# Patient Record
Sex: Male | Born: 1995 | Race: Black or African American | Hispanic: No | Marital: Single | Smoking: Never smoker
Health system: Southern US, Community
[De-identification: ages and names within clinical notes are randomized; demographics above are authoritative.]

## PROBLEM LIST (undated history)

## (undated) DIAGNOSIS — J45909 Unspecified asthma, uncomplicated: Secondary | ICD-10-CM

## (undated) DIAGNOSIS — R51 Headache: Secondary | ICD-10-CM

## (undated) DIAGNOSIS — R519 Headache, unspecified: Secondary | ICD-10-CM

## (undated) HISTORY — PX: HAND SURGERY: SHX662

## (undated) HISTORY — PX: KNEE SURGERY: SHX244

---
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
## 2016-05-01 ENCOUNTER — Encounter: Payer: Self-pay | Admitting: *Deleted

## 2016-05-01 ENCOUNTER — Emergency Department
Admission: EM | Admit: 2016-05-01 | Discharge: 2016-05-01 | Disposition: A | Discharge status: Home / Self Care | Payer: BLUE CROSS/BLUE SHIELD | Attending: Emergency Medicine | Admitting: Emergency Medicine

## 2016-05-01 ENCOUNTER — Emergency Department: Payer: BLUE CROSS/BLUE SHIELD

## 2016-05-01 DIAGNOSIS — R509 Fever, unspecified: Secondary | ICD-10-CM | POA: Diagnosis present

## 2016-05-01 DIAGNOSIS — J45909 Unspecified asthma, uncomplicated: Secondary | ICD-10-CM | POA: Insufficient documentation

## 2016-05-01 DIAGNOSIS — B349 Viral infection, unspecified: Secondary | ICD-10-CM | POA: Diagnosis not present

## 2016-05-01 DIAGNOSIS — G44209 Tension-type headache, unspecified, not intractable: Secondary | ICD-10-CM | POA: Diagnosis not present

## 2016-05-01 HISTORY — DX: Unspecified asthma, uncomplicated: J45.909

## 2016-05-01 LAB — POCT RAPID STREP A: Streptococcus, Group A Screen (Direct): NEGATIVE

## 2016-05-01 MED ORDER — BUTALBITAL-APAP-CAFFEINE 50-325-40 MG PO TABS: 1.0000 | ORAL_TABLET | Freq: Four times a day (QID) | ORAL | 0 refills | Status: DC | PRN

## 2016-05-01 NOTE — Discharge Instructions (Signed)
Advised to follow-up with family doctor for further evaluation of headache.

## 2016-05-01 NOTE — ED Provider Notes (Signed)
[facility] Emergency Department Provider Note   ____________________________________________   First MD Initiated Contact with Patient 05/01/16 1523     (approximate)  I have reviewed the triage vital signs and the nursing notes.   HISTORY  Chief Complaint Fever; Headache; and Sore Throat    HPI Shane Mccarthy is a 21 y.o. male patient complaining of headache, sore throat, and body aches 4 days. Patient denies sinus congestion. Patient denies vertigo or vision disturbance. Patient stated mild transient relief with ibuprofen of his body ache and headache. Mother is with patient and insistent that extensive workup be conducted for his headaches since it the  worse  he has ever experienced. Patient rates his headache and bodyache as 8/10. Patient described complaints "achy".   Past Medical History:  Diagnosis Date  . Asthma     There are no active problems to display for this patient.   History reviewed. No pertinent surgical history.  Prior to Admission medications   Medication Sig Start Date End Date Taking? Authorizing Provider  butalbital-acetaminophen-caffeine (FIORICET, ESGIC) [phone] MG tablet Take 1-2 tablets by mouth every 6 (six) hours as needed for headache. 05/01/16 05/01/17  Joni Reining, PA-C    Allergies Patient has no known allergies.  History reviewed. No pertinent family history.  Social History Social History  Substance Use Topics  . Smoking status: Never Smoker  . Smokeless tobacco: Never Used  . Alcohol use No    Review of Systems Constitutional: No fever/chills. Bodyache Eyes: No visual changes. ENT: No sore throat. Cardiovascular: Denies chest pain. Respiratory: Denies shortness of breath. Gastrointestinal: No abdominal pain.  No nausea, no vomiting.  No diarrhea.  No constipation. Genitourinary: Negative for dysuria. Musculoskeletal: Negative for back pain. Skin: Negative for rash. Neurological: Positive  for headaches, but denied focal weakness or numbness.  ____________________________________________   PHYSICAL EXAM:  VITAL SIGNS: ED Triage Vitals  Enc Vitals Group     BP 05/01/16 1508 137/79     Pulse Rate 05/01/16 1508 77     Resp 05/01/16 1508 18     Temp 05/01/16 1508 (!) 100.4 F (38 C)     Temp Source 05/01/16 1508 Oral     SpO2 05/01/16 1508 97 %     Weight 05/01/16 1509 200 lb (90.7 kg)     Height 05/01/16 1509  (1.753 m)     Head Circumference --      Peak Flow --      Pain Score 05/01/16 1508 8     Pain Loc --      Pain Edu? --      Excl. in GC? --     Constitutional: Alert and oriented. Well appearing and in no acute distress. Eyes: Conjunctivae are normal. PERRL. EOMI. Head: Atraumatic. Nose: No congestion/rhinnorhea. Mouth/Throat: Mucous membranes are moist.  Oropharynx non-erythematous. Neck: No stridor.  No cervical spine tenderness to palpation. Hematological/Lymphatic/Immunilogical: No cervical lymphadenopathy. Cardiovascular: Normal rate, regular rhythm. Grossly normal heart sounds.  Good peripheral circulation. Respiratory: Normal respiratory effort.  No retractions. Lungs CTAB. Gastrointestinal: Soft and nontender. No distention. No abdominal bruits. No CVA tenderness. Musculoskeletal: No lower extremity tenderness nor edema.  No joint effusions. Neurologic:  Normal speech and language. No gross focal neurologic deficits are appreciated. No gait instability. Skin:  Skin is warm, dry and intact. No rash noted. Psychiatric: Mood and affect are normal. Speech and behavior are normal.  ____________________________________________   LABS (all labs ordered are listed, but  only abnormal results are displayed)  Labs Reviewed  POCT RAPID STREP A   ____________________________________________  EKG   ____________________________________________  RADIOLOGY   _CT of the head unremarkable.  ___________________________________________   PROCEDURES  Procedure(s) performed: None  Procedures  Critical Care performed: No  ____________________________________________   INITIAL IMPRESSION / ASSESSMENT AND PLAN / ED COURSE  Pertinent labs & imaging results that were available during my care of the patient were reviewed by me and considered in my medical decision making (see chart for details). Tension headache and viral illness. CT scan pending.        ____________________________________________   FINAL CLINICAL IMPRESSION(S) / ED DIAGNOSES  Final diagnoses:  Viral illness  Tension headache  Discuss CT findings with patient and mother. Mother is requesting additional testing consisted of blood work due to the patient having headaches for 2-3 months. Advised mother that fuerther evaluation and treatment should be done by PCP due to the chronic nature of his complaint. Oral temperature has decreased from 100.4-98.6 without intervention.    NEW MEDICATIONS STARTED DURING THIS VISIT:  New Prescriptions   BUTALBITAL-ACETAMINOPHEN-CAFFEINE (FIORICET, ESGIC) 50-325-40 MG TABLET    Take 1-2 tablets by mouth every 6 (six) hours as needed for headache.     Note:  This document was prepared using Dragon voice recognition software and may include unintentional dictation errors.    Joni Reining, PA-C 05/01/16 1614    Nita Sickle, MD 05/05/16 [phone]

## 2016-05-01 NOTE — ED Triage Notes (Signed)
States headache, sore throat, body aches since Sunday, denies any cough, awake and alert in no acute distress

## 2016-05-01 NOTE — ED Notes (Signed)
See triage note, pt reports HA since Sunday, pt reports sore throat started follow by headache. Pt reports this has happened in December and again in the beginning of this year. Denies sensitivity to light however reports "headache to top of head and left side of face." Pt also reports pain increases to head when he bends down and when he lays down. Pt reports pain gets better when he gets up. Pt A&O at this time.

## 2016-05-26 ENCOUNTER — Encounter: Payer: Self-pay | Admitting: *Deleted

## 2016-05-28 ENCOUNTER — Encounter
Admission: RE | Disposition: A | Discharge status: Home / Self Care | Payer: Self-pay | Source: Ambulatory Visit | Attending: Otolaryngology

## 2016-05-28 ENCOUNTER — Ambulatory Visit: Payer: BLUE CROSS/BLUE SHIELD | Admitting: Anesthesiology

## 2016-05-28 ENCOUNTER — Ambulatory Visit
Admission: RE | Admit: 2016-05-28 | Discharge: 2016-05-28 | Disposition: A | Discharge status: Home / Self Care | Payer: BLUE CROSS/BLUE SHIELD | Source: Ambulatory Visit | Attending: Otolaryngology | Admitting: Otolaryngology

## 2016-05-28 DIAGNOSIS — J45909 Unspecified asthma, uncomplicated: Secondary | ICD-10-CM | POA: Diagnosis not present

## 2016-05-28 DIAGNOSIS — J351 Hypertrophy of tonsils: Secondary | ICD-10-CM | POA: Insufficient documentation

## 2016-05-28 DIAGNOSIS — J358 Other chronic diseases of tonsils and adenoids: Secondary | ICD-10-CM | POA: Diagnosis not present

## 2016-05-28 HISTORY — PX: TONSILLECTOMY: SHX5217

## 2016-05-28 HISTORY — DX: Headache: R51

## 2016-05-28 HISTORY — DX: Headache, unspecified: R51.9

## 2016-05-28 SURGERY — TONSILLECTOMY
Anesthesia: General | Site: Throat | Wound class: Dirty or Infected

## 2016-05-28 MED ORDER — OXYCODONE HCL 5 MG PO TABS: 5.0000 mg | ORAL_TABLET | Freq: Once | ORAL | Status: AC | PRN

## 2016-05-28 MED ORDER — ACETAMINOPHEN 10 MG/ML IV SOLN
1000.0000 mg | Freq: Once | INTRAVENOUS | Status: AC
  Administered 2016-05-28: 1000 mg via INTRAVENOUS

## 2016-05-28 MED ORDER — ONDANSETRON HCL 4 MG PO TABS: 4.0000 mg | ORAL_TABLET | Freq: Three times a day (TID) | ORAL | 0 refills | Status: AC | PRN

## 2016-05-28 MED ORDER — BUPIVACAINE HCL (PF) 0.25 % IJ SOLN
INTRAMUSCULAR | Status: DC | PRN
  Administered 2016-05-28: 1 mL

## 2016-05-28 MED ORDER — FENTANYL CITRATE (PF) 100 MCG/2ML IJ SOLN
INTRAMUSCULAR | Status: DC | PRN
  Administered 2016-05-28: 100 ug via INTRAVENOUS

## 2016-05-28 MED ORDER — OXYMETAZOLINE HCL 0.05 % NA SOLN
NASAL | Status: DC | PRN
  Administered 2016-05-28: 1 via TOPICAL

## 2016-05-28 MED ORDER — OXYCODONE HCL 5 MG/5ML PO SOLN
5.0000 mg | Freq: Once | ORAL | Status: AC | PRN
Start: 2016-05-28 — End: 2016-05-28
  Administered 2016-05-28: 5 mg via ORAL

## 2016-05-28 MED ORDER — ONDANSETRON HCL 4 MG/2ML IJ SOLN
INTRAMUSCULAR | Status: DC | PRN
  Administered 2016-05-28: 4 mg via INTRAVENOUS

## 2016-05-28 MED ORDER — GLYCOPYRROLATE 0.2 MG/ML IJ SOLN
INTRAMUSCULAR | Status: DC | PRN
  Administered 2016-05-28: 0.1 mg via INTRAVENOUS

## 2016-05-28 MED ORDER — PROPOFOL 10 MG/ML IV BOLUS
INTRAVENOUS | Status: DC | PRN
  Administered 2016-05-28 (×2): 50 mg via INTRAVENOUS
  Administered 2016-05-28: 200 mg via INTRAVENOUS

## 2016-05-28 MED ORDER — ONDANSETRON HCL 4 MG/2ML IJ SOLN: 4.0000 mg | Freq: Once | INTRAMUSCULAR | Status: DC | PRN

## 2016-05-28 MED ORDER — SCOPOLAMINE 1 MG/3DAYS TD PT72
1.0000 | MEDICATED_PATCH | Freq: Once | TRANSDERMAL | Status: DC
  Administered 2016-05-28: 1.5 mg via TRANSDERMAL

## 2016-05-28 MED ORDER — MIDAZOLAM HCL 5 MG/5ML IJ SOLN
INTRAMUSCULAR | Status: DC | PRN
  Administered 2016-05-28: 2 mg via INTRAVENOUS

## 2016-05-28 MED ORDER — FENTANYL CITRATE (PF) 100 MCG/2ML IJ SOLN
25.0000 ug | INTRAMUSCULAR | Status: DC | PRN
  Administered 2016-05-28: 25 ug via INTRAVENOUS

## 2016-05-28 MED ORDER — LIDOCAINE VISCOUS 2 % MT SOLN: 10.0000 mL | Freq: Four times a day (QID) | OROMUCOSAL | 0 refills | Status: AC | PRN

## 2016-05-28 MED ORDER — OXYCODONE HCL 5 MG/5ML PO SOLN: 10.0000 mg | ORAL | 0 refills | Status: AC | PRN

## 2016-05-28 MED ORDER — DEXAMETHASONE SODIUM PHOSPHATE 4 MG/ML IJ SOLN
INTRAMUSCULAR | Status: DC | PRN
Start: 2016-05-28 — End: 2016-05-28
  Administered 2016-05-28: 10 mg via INTRAVENOUS

## 2016-05-28 MED ORDER — SUCCINYLCHOLINE CHLORIDE 20 MG/ML IJ SOLN
INTRAMUSCULAR | Status: DC | PRN
  Administered 2016-05-28: 100 mg via INTRAVENOUS

## 2016-05-28 MED ORDER — LIDOCAINE HCL (CARDIAC) 20 MG/ML IV SOLN
INTRAVENOUS | Status: DC | PRN
  Administered 2016-05-28: 50 mg via INTRAVENOUS

## 2016-05-28 MED ORDER — LACTATED RINGERS IV SOLN
INTRAVENOUS | Status: DC
  Administered 2016-05-28 (×2): via INTRAVENOUS

## 2016-05-28 SURGICAL SUPPLY — 17 items
BLADE BOVIE TIP EXT 4 (BLADE) ×3 IMPLANT
CANISTER SUCT 1200ML W/VALVE (MISCELLANEOUS) ×3 IMPLANT
CATH ROBINSON RED A/P 10FR (CATHETERS) ×3 IMPLANT
COAG SUCT 10F 3.5MM HAND CTRL (MISCELLANEOUS) ×3 IMPLANT
GLOVE BIO SURGEON STRL SZ7.5 (GLOVE) ×6 IMPLANT
HANDLE SUCTION POOLE (INSTRUMENTS) ×1 IMPLANT
KIT ROOM TURNOVER OR (KITS) ×3 IMPLANT
NEEDLE HYPO 25GX1X1/2 BEV (NEEDLE) ×3 IMPLANT
NS IRRIG 500ML POUR BTL (IV SOLUTION) ×3 IMPLANT
PACK TONSIL/ADENOIDS (PACKS) ×3 IMPLANT
PAD GROUND ADULT SPLIT (MISCELLANEOUS) ×3 IMPLANT
PENCIL ELECTRO HAND CTR (MISCELLANEOUS) ×3 IMPLANT
SOL ANTI-FOG 6CC FOG-OUT (MISCELLANEOUS) ×1 IMPLANT
SOL FOG-OUT ANTI-FOG 6CC (MISCELLANEOUS) ×2
STRAP BODY AND KNEE 60X3 (MISCELLANEOUS) ×3 IMPLANT
SUCTION POOLE HANDLE (INSTRUMENTS) ×3
SYR 5ML LL (SYRINGE) ×3 IMPLANT

## 2016-05-28 NOTE — Transfer of Care (Signed)
Immediate Anesthesia Transfer of Care Note  Patient: Shane CarbonMichael D Cleary  Procedure(s) Performed: Procedure(s): TONSILLECTOMY (N/A)  Patient Location: PACU  Anesthesia Type: General ETT  Level of Consciousness: awake, alert  and patient cooperative  Airway and Oxygen Therapy: Patient Spontanous Breathing and Patient connected to supplemental oxygen  Post-op Assessment: Post-op Vital signs reviewed, Patient's Cardiovascular Status Stable, Respiratory Function Stable, Patent Airway and No signs of Nausea or vomiting  Post-op Vital Signs: Reviewed and stable  Complications: No apparent anesthesia complications

## 2016-05-28 NOTE — Anesthesia Postprocedure Evaluation (Signed)
Anesthesia Post Note  Patient: Shane CarbonMichael D Schobert  Procedure(s) Performed: Procedure(s) (LRB): TONSILLECTOMY (N/A)  Patient location during evaluation: PACU Anesthesia Type: General Level of consciousness: awake and alert and oriented Pain management: satisfactory to patient Vital Signs Assessment: post-procedure vital signs reviewed and stable Respiratory status: spontaneous breathing, nonlabored ventilation and respiratory function stable Cardiovascular status: blood pressure returned to baseline and stable Postop Assessment: Adequate PO intake and No signs of nausea or vomiting Anesthetic complications: no    Cherly BeachStella, Evian J

## 2016-05-28 NOTE — Discharge Instructions (Signed)
T & A INSTRUCTION SHEET - [facility] °[facility] ° °CREIGHTON VAUGHT, MD °PAUL H. JUENGEL, MD  °P. [facility] °CHAPMAN MCQUEEN, MD ° °[address] , [place] [address] TEL. [phone] °[address] [place] [address] [phone] ° °INFORMATION SHEET FOR A TONSILLECTOMY AND ADENDOIDECTOMY ° °About Your Tonsils and Adenoids ° The tonsils and adenoids are normal body tissues that are part of our immune system.  They normally help to protect us against diseases that may enter our mouth and nose.  However, sometimes the tonsils and/or adenoids become too large and obstruct our breathing, especially at night. °  ° If either of these things happen it helps to remove the tonsils and adenoids in order to become healthier. The operation to remove the tonsils and adenoids is called a tonsillectomy and adenoidectomy. ° °The Location of Your Tonsils and Adenoids ° The tonsils are located in the back of the throat on both side and sit in a cradle of muscles. The adenoids are located in the roof of the mouth, behind the nose, and closely associated with the opening of the Eustachian tube to the ear. ° °Surgery on Tonsils and Adenoids ° A tonsillectomy and adenoidectomy is a short operation which takes about thirty minutes.  This includes being put to sleep and being awakened.  Tonsillectomies and adenoidectomies are performed at [facility] and may require observation period in the recovery room prior to going home. ° °Following the Operation for a Tonsillectomy ° A cautery machine is used to control bleeding.  Bleeding from a tonsillectomy and adenoidectomy is minimal and postoperatively the risk of bleeding is approximately four percent, although this rarely life threatening. ° ° ° °After your tonsillectomy and adenoidectomy post-op care at home: ° °1. Our patients are able to go home the same day.  You may be given prescriptions for pain  medications and antibiotics, if indicated. °2. It is extremely important to remember that fluid intake is of utmost importance after a tonsillectomy.  The amount that you drink must be maintained in the postoperative period.  A good indication of whether a child is getting enough fluid is whether his/her urine output is constant.  As long as children are urinating or wetting their diaper every 6 - 8 hours this is usually enough fluid intake.   °3. Although rare, this is a risk of some bleeding in the first ten days after surgery.  This is usually occurs between day five and nine postoperatively.  This risk of bleeding is approximately four percent.  If you or your child should have any bleeding you should remain calm and notify our office or go directly to the Emergency Room at [facility] where they will contact us. Our doctors are available seven days a week for notification.  We recommend sitting up quietly in a chair, place an ice pack on the front of the neck and spitting out the blood gently until we are able to contact you.  Adults should gargle gently with ice water and this may help stop the bleeding.  If the bleeding does not stop after a short time, i.e. 10 to 15 minutes, or seems to be increasing again, please contact us or go to the hospital.   °4. It is common for the pain to be worse at 5 - 7 days postoperatively.  This occurs because the “scab” is peeling off and the mucous membrane (skin of   the throat) is growing back where the tonsils were.   °5. It is common for a low-grade fever, less than 102, during the first week after a tonsillectomy and adenoidectomy.  It is usually due to not drinking enough liquids, and we suggest your use liquid Tylenol or the pain medicine with Tylenol prescribed in order to keep your temperature below 102.  Please follow the directions on the back of the bottle. °6. Do not take aspirin or any products that contain aspirin such as Bufferin, Anacin,  Ecotrin, aspirin gum, Goodies, BC headache powders, etc., after a T&A because it can promote bleeding.  Please check with our office before administering any other medication that may been prescribed by other doctors during the two week post-operative period. °7. If you happen to look in the mirror or into your child’s mouth you will see white/gray patches on the back of the throat.  This is what a scab looks like in the mouth and is normal after having a T&A.  It will disappear once the tonsil area heals completely. However, it may cause a noticeable odor, and this too will disappear with time.     °8. You or your child may experience ear pain after having a T&A.  This is called referred pain and comes from the throat, but it is felt in the ears.  Ear pain is quite common and expected.  It will usually go away after ten days.  There is usually nothing wrong with the ears, and it is primarily due to the healing area stimulating the nerve to the ear that runs along the side of the throat.  Use either the prescribed pain medicine or Tylenol as needed.  °9. The throat tissues after a tonsillectomy are obviously sensitive.  Smoking around children who have had a tonsillectomy significantly increases the risk of bleeding.  DO NOT SMOKE!  ° °Scopolamine skin patches °REMOVE PATCH IN 72 HOURS AND WASH HANDS IMMEDIATELY °What is this medicine? °SCOPOLAMINE (skoe POL a meen) is used to prevent nausea and vomiting caused by motion sickness, anesthesia and surgery. °This medicine may be used for other purposes; ask your health care provider or pharmacist if you have questions. °COMMON BRAND NAME(S): Transderm Scop °What should I tell my health care provider before I take this medicine? °They need to know if you have any of these conditions: °-glaucoma °-kidney or liver disease °-an unusual or allergic reaction (especially skin allergy) to scopolamine, atropine, other medicines, foods, dyes, or preservatives °-pregnant or  trying to get pregnant °-breast-feeding °How should I use this medicine? °This medicine is for external use only. Follow the directions on the prescription label. One patch contains enough medicine to prevent motion sickness for up to 3 days. Apply the patch at least 4 hours before you need it and only wear one disc at a time. Choose an area behind the ear, that is clean, dry, hairless and free from any cuts or irritation. Wipe the area with a clean dry tissue. Peel off the plastic backing of the skin patch, trying not to touch the adhesive side with your hands. Do not cut the patches. Firmly apply to the area you have chosen, with the metallic side of the patch to the skin and the tan-colored side showing. Once firmly in place, wash your hands well with soap and water. Remove the disc after 3 days, or sooner if you no longer need it. After removing the patch, wash your hands and the area behind   your ear thoroughly with soap and water. The patch will still contain some medicine after use. To avoid accidental contact or ingestion by children or pets, fold the used patch in half with the sticky side together and throw away in the trash out of the reach of children and pets. If you need to use a second patch after you remove the first, place it behind the other ear. °Talk to your pediatrician regarding the use of this medicine in children. Special care may be needed. °Overdosage: If you think you have taken too much of this medicine contact a poison control center or emergency room at once. °NOTE: This medicine is only for you. Do not share this medicine with others. °What if I miss a dose? °Make sure you apply the patch at least 4 hours before you need it. You can apply it the night before traveling. °What may interact with this medicine? °-benztropine °-bethanechol °-medicines for anxiety or sleeping problems like diazepam or temazepam °-medicines for hay fever and other allergies °-medicines for mental  depression °-muscle relaxants °This list may not describe all possible interactions. Give your health care provider a list of all the medicines, herbs, non-prescription drugs, or dietary supplements you use. Also tell them if you smoke, drink alcohol, or use illegal drugs. Some items may interact with your medicine. °What should I watch for while using this medicine? °Keep the patch dry, if possible, to prevent it from falling off. Limited contact with water, however, as in bathing or swimming, will not affect the system. If the patch falls off, throw it away and put a new one behind the other ear. °You may get drowsy or dizzy. Do not drive, use machinery, or do anything that needs mental alertness until you know how this medicine affects you. Do not stand or sit up quickly, especially if you are an older patient. This reduces the risk of dizzy or fainting spells. Alcohol may interfere with the effect of this medicine. Avoid alcoholic drinks. °Your mouth may get dry. Chewing sugarless gum or sucking hard candy, and drinking plenty of water may help. Contact your doctor if the problem does not go away or is severe. °This medicine may cause dry eyes and blurred vision. If you wear contact lenses you may feel some discomfort. Lubricating drops may help. See your eye doctor if the problem does not go away or is severe. °If you are going to have a magnetic resonance imaging (MRI) procedure, tell your MRI technician if you have this patch on your body. It must be removed before a MRI. °What side effects may I notice from receiving this medicine? °Side effects that you should report to your doctor or health care professional as soon as possible: °-agitation, nervousness, confusion °-blurred vision and other eye problems °-dizziness, drowsiness °-eye pain or redness in the whites of the eye °-hallucinations °-pain or difficulty passing urine °-skin rash, itching °-vomiting °Side effects that usually do not require medical  attention (report to your doctor or health care professional if they continue or are bothersome): °-headache °-nausea °This list may not describe all possible side effects. Call your doctor for medical advice about side effects. You may report side effects to FDA at 1-800-FDA-1088. °Where should I keep my medicine? °Keep out of the reach of children. °Store at room temperature between 20 and 25 degrees C (68 and 77 degrees F). Throw away any unused medicine after the expiration date. When you remove a patch, fold it and throw   it in the trash as described above. NOTE: This sheet is a summary. It may not cover all possible information. If you have questions about this medicine, talk to your doctor, pharmacist, or health care provider.  2018 Elsevier/Gold Standard (2011-05-29 13:31:48)   General Anesthesia, Adult, Care After These instructions provide you with information about caring for yourself after your procedure. Your health care provider may also give you more specific instructions. Your treatment has been planned according to current medical practices, but problems sometimes occur. Call your health care provider if you have any problems or questions after your procedure. What can I expect after the procedure? After the procedure, it is common to have:  Vomiting.  A sore throat.  Mental slowness. It is common to feel:  Nauseous.  Cold or shivery.  Sleepy.  Tired.  Sore or achy, even in parts of your body where you did not have surgery. Follow these instructions at home: For at least 24 hours after the procedure:   Do not:  Participate in activities where you could fall or become injured.  Drive.  Use heavy machinery.  Drink alcohol.  Take sleeping pills or medicines that cause drowsiness.  Make important decisions or sign legal documents.  Take care of children on your own.  Rest. Eating and drinking   If you vomit, drink water, juice, or soup when you can drink  without vomiting.  Drink enough fluid to keep your urine clear or pale yellow.  Make sure you have little or no nausea before eating solid foods.  Follow the diet recommended by your health care provider. General instructions   Have a responsible adult stay with you until you are awake and alert.  Return to your normal activities as told by your health care provider. Ask your health care provider what activities are safe for you.  Take over-the-counter and prescription medicines only as told by your health care provider.  If you smoke, do not smoke without supervision.  Keep all follow-up visits as told by your health care provider. This is important. Contact a health care provider if:  You continue to have nausea or vomiting at home, and medicines are not helpful.  You cannot drink fluids or start eating again.  You cannot urinate after 8-12 hours.  You develop a skin rash.  You have fever.  You have increasing redness at the site of your procedure. Get help right away if:  You have difficulty breathing.  You have chest pain.  You have unexpected bleeding.  You feel that you are having a life-threatening or urgent problem. This information is not intended to replace advice given to you by your health care provider. Make sure you discuss any questions you have with your health care provider. Document Released: 04/07/2000 Document Revised: 06/04/2015 Document Reviewed: 12/14/2014 Elsevier Interactive Patient Education  2017 [employer]

## 2016-05-28 NOTE — Anesthesia Preprocedure Evaluation (Signed)
Anesthesia Evaluation  Patient identified by MRN, date of birth, ID band Patient awake    Reviewed: Allergy & Precautions, H&P , NPO status , Patient's Chart, lab work & pertinent test results  Airway Mallampati: II  TM Distance: >3 FB Neck ROM: full    Dental no notable dental hx.    Pulmonary asthma ,    Pulmonary exam normal        Cardiovascular Normal cardiovascular exam     Neuro/Psych    GI/Hepatic   Endo/Other    Renal/GU      Musculoskeletal   Abdominal   Peds  Hematology   Anesthesia Other Findings   Reproductive/Obstetrics                             Anesthesia Physical Anesthesia Plan  ASA: II  Anesthesia Plan: General ETT   Post-op Pain Management:    Induction:   Airway Management Planned:   Additional Equipment:   Intra-op Plan:   Post-operative Plan:   Informed Consent: I have reviewed the patients History and Physical, chart, labs and discussed the procedure including the risks, benefits and alternatives for the proposed anesthesia with the patient or authorized representative who has indicated his/her understanding and acceptance.     Plan Discussed with:   Anesthesia Plan Comments:         Anesthesia Quick Evaluation

## 2016-05-28 NOTE — Op Note (Signed)
..  05/28/2016  9:10 AM    Shane QualiaHarewood, Shane Mccarthy  [id number]   Pre-Op Dx:  tonsil hypertrophy chronic pharyrgitis, tonsillolithiasis  Post-op Dx: tonsil hypertrophy chronic pharyrgitis, tonsillolithiasis  Proc:Tonsillectomy > age 21  Surg: Shane Mccarthy  Anes:  General Endotracheal  EBL:  <375ml  Comp:  None  Findings:  3+ cryptic tonsils with tonsillolithiasis  Procedure: After the patient was identified in holding and the history and physical and consent was reviewed, the patient was taken to the operating room and placed in a supine position.  General endotracheal anesthesia was induced in the normal fashion.  At this time, the patient was rotated 45 degrees and a shoulder roll was placed.  At this time, a McIvor mouthgag was inserted into the patient's oral cavity and suspended from the Mayo stand without injury to teeth, lips, or gums.  Next a red rubber catheter was inserted into the patient left nostril for retraction of the uvula and soft palate superiorly.  Next a curved Alice clamp was attached to the patient's right superior tonsillar pole and retracted medially and inferiorly.  A Bovie electrocautery was used to dissect the patient's right tonsil in a subcapsular plane.  Meticulous hemostasis was achieved with Bovie suction cautery.  At this time, the mouth gag was released from suspension for 1 minute.  Attention now was directed to the patient's left side.  In a similar fashion the curved Alice clamp was attached to the superior pole and this was retracted medially and inferiorly and the tonsil was excised in a subcapsular plane with Bovie electrocautery.  After completion of the second tonsil, meticulous hemostasis was continued.  At this time, the patient's nasal cavity and oral cavity was irrigated with sterile saline.  One ml of 0.25% Marcaine was injected into the anterior and posterior tonsillar fossa bilaterally.  Following this, the care of patient was returned to  anesthesia, awakened, and transferred to recovery in stable condition.  Dispo:  PACU to home  Plan: Soft diet.  Limit exercise and strenuous activity for 2 weeks.  Fluid hydration  Recheck my office three weeks.   Shane Mccarthy 9:10 AM 05/28/2016

## 2016-05-28 NOTE — H&P (Signed)
..  History and Physical paper copy reviewed and updated date of procedure and will be scanned into system.  Patient seen and examined.  

## 2016-05-28 NOTE — Anesthesia Procedure Notes (Signed)
Procedure Name: Intubation Date/Time: 05/28/2016 8:40 AM Performed by: Jimmy PicketAMYOT, Javarian Pre-anesthesia Checklist: Patient identified, Emergency Drugs available, Suction available, Patient being monitored and Timeout performed Patient Re-evaluated:Patient Re-evaluated prior to inductionOxygen Delivery Method: Circle system utilized Preoxygenation: Pre-oxygenation with 100% oxygen Intubation Type: IV induction Ventilation: Mask ventilation without difficulty Laryngoscope Size: Miller and 3 Grade View: Grade I Tube type: Oral Rae Tube size: 7.5 mm Number of attempts: 1 Placement Confirmation: ETT inserted through vocal cords under direct vision,  positive ETCO2 and breath sounds checked- equal and bilateral Tube secured with: Tape Dental Injury: Teeth and Oropharynx as per pre-operative assessment

## 2016-05-29 ENCOUNTER — Encounter: Payer: Self-pay | Admitting: Otolaryngology

## 2016-05-30 LAB — SURGICAL PATHOLOGY

## 2018-05-11 IMAGING — CT CT HEAD W/O CM
3 series · 16 of 47 positions shown, 19 images · non-contrast
Comparison: None.

CLINICAL DATA: Sore throat, headache starting [REDACTED]

EXAM:
CT HEAD WITHOUT CONTRAST
TECHNIQUE: Contiguous axial images were obtained from the base of the skull
through the vertex without intravenous contrast.

[Series 2: head wo · axial · 0.41mm/px · z∈[+288,+413]mm · 10 of 30 slices shown, 13 images]
[im 3/30  brain]
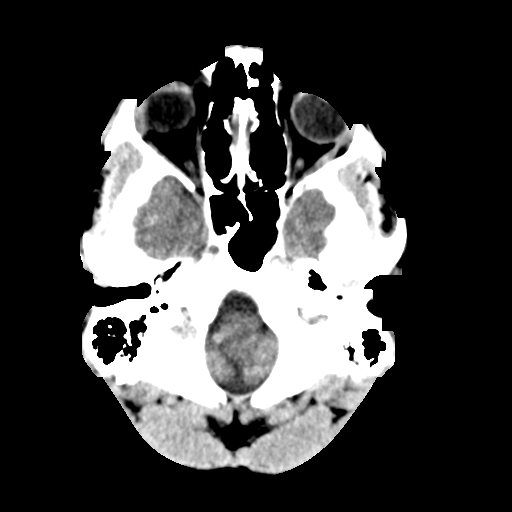
[im 3/30  bone]
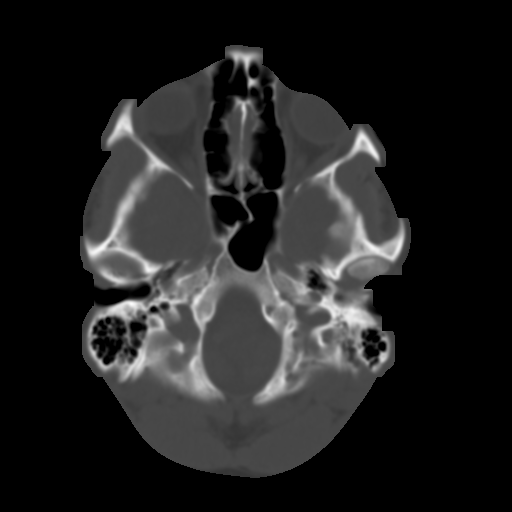
[im 6/30  brain]
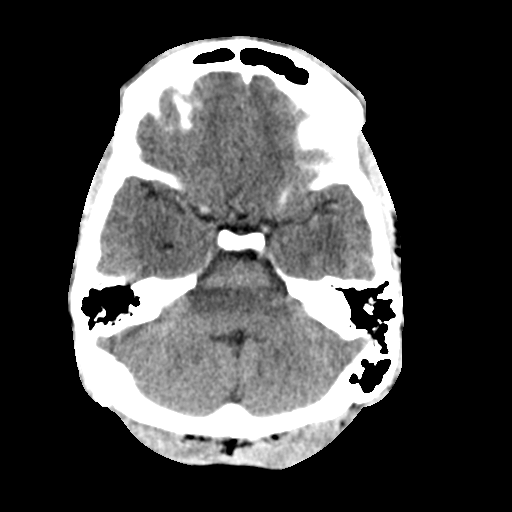
[im 9/30  brain]
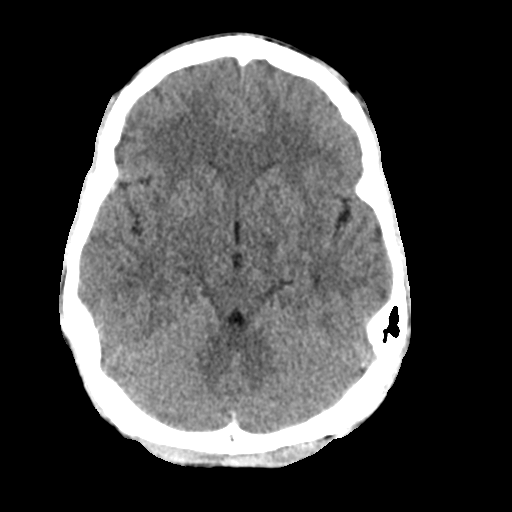
[im 11/30  brain]
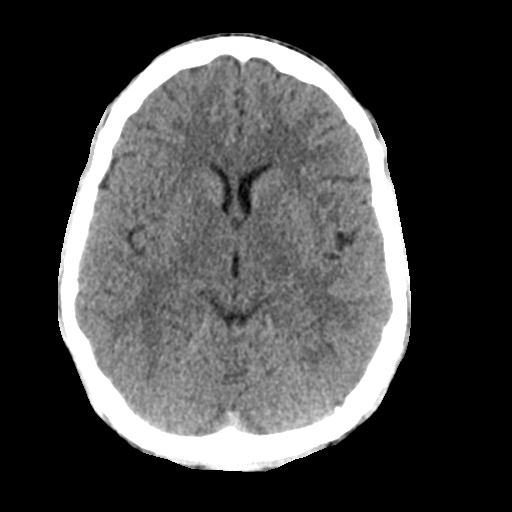
[im 14/30  brain]
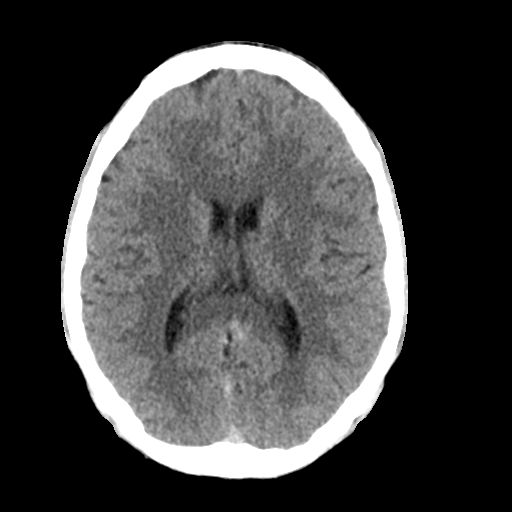
[im 14/30  bone]
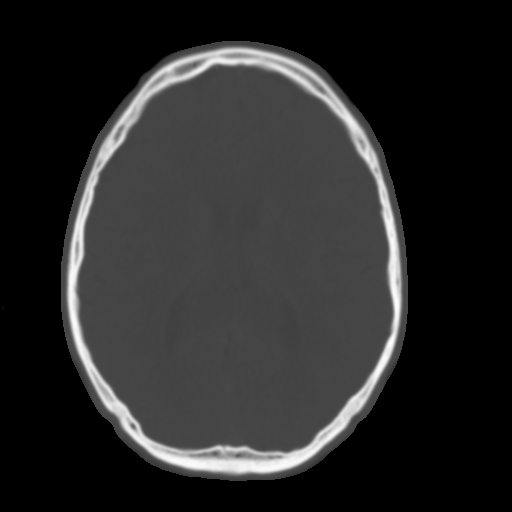
[im 17/30  brain]
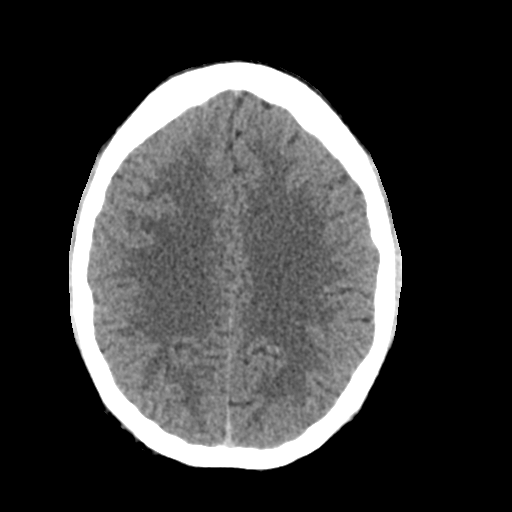
[im 20/30  brain]
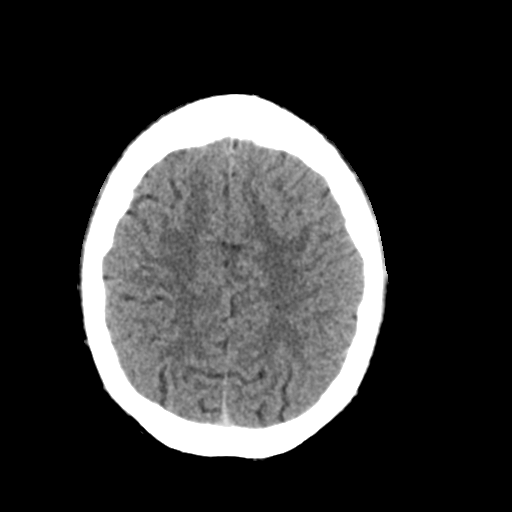
[im 23/30  brain]
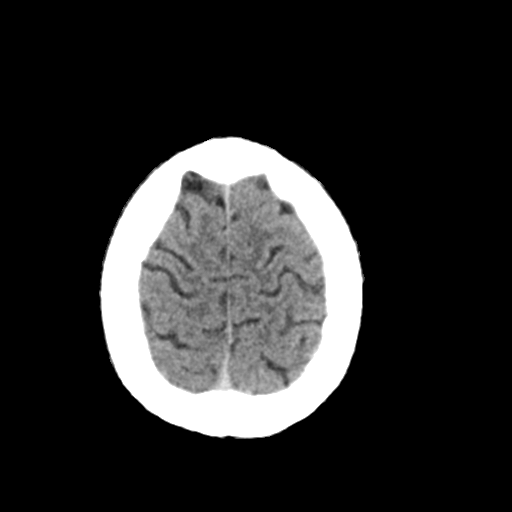
[im 25/30  brain]
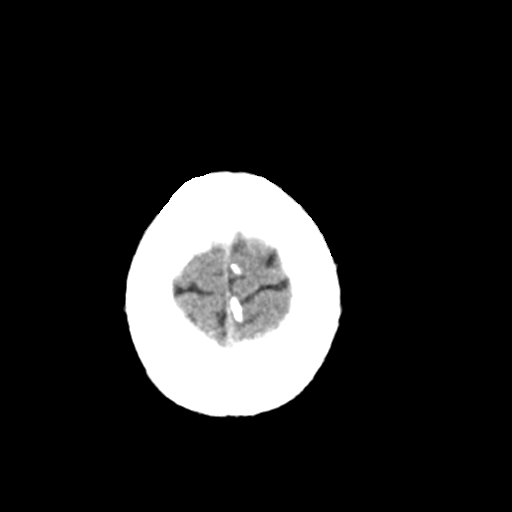
[im 25/30  bone]
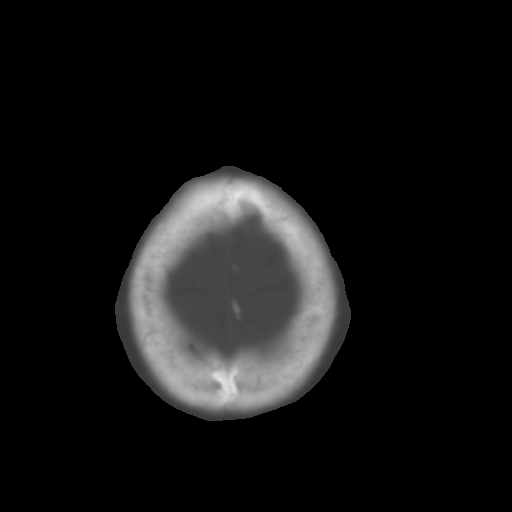
[im 28/30  brain]
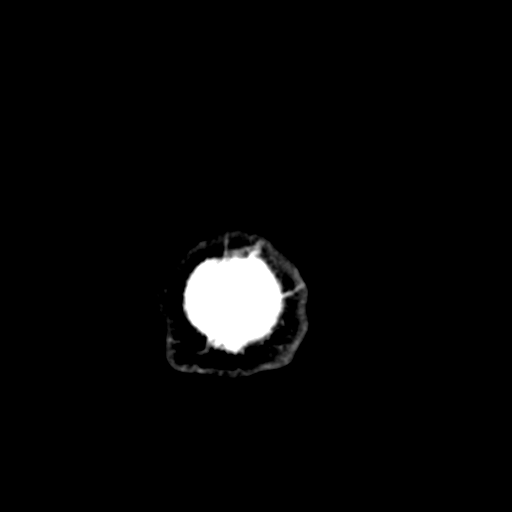

[Series 4: coronal soft tissue · coronal · 0.30mm/px · 3 of 60 slices shown]
[im 20/60  brain]
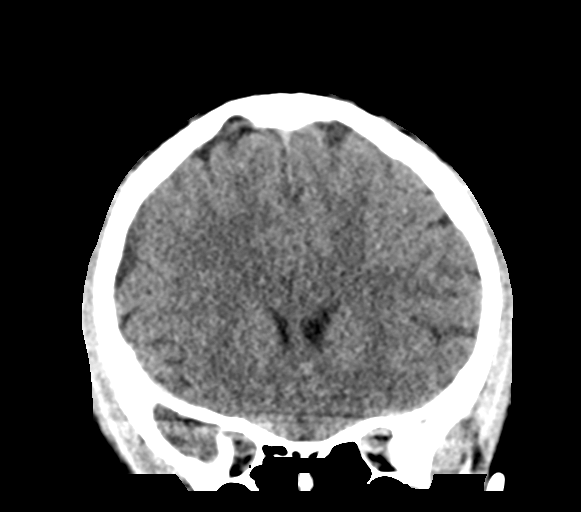
[im 27/60  brain]
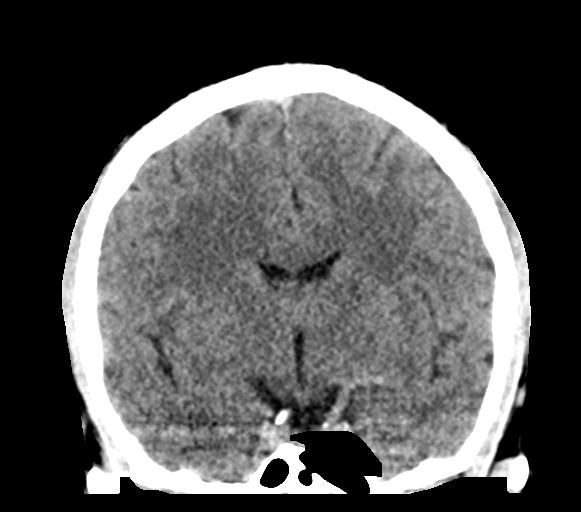
[im 33/60  brain]
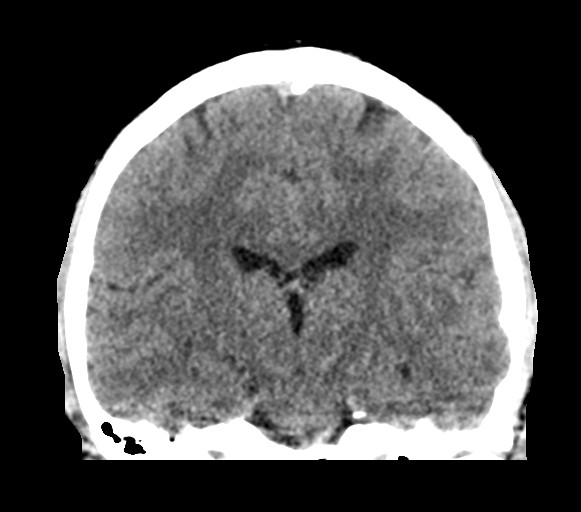

[Series 5: sagittal soft tissue · sagittal · 0.31mm/px · 3 of 49 slices shown]
[im 17/49  brain]
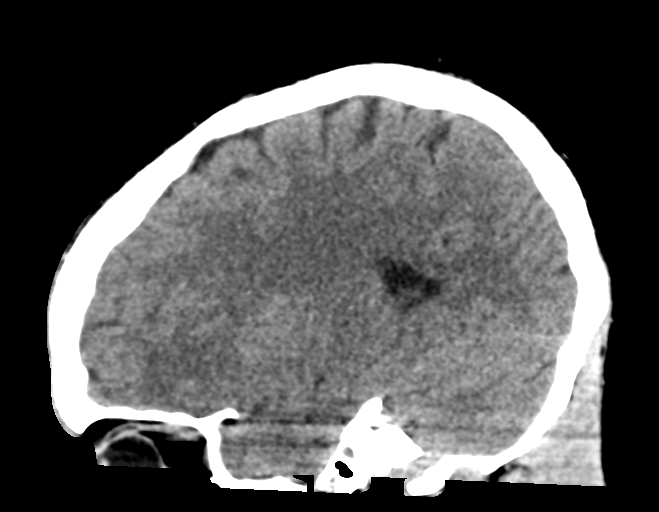
[im 25/49  brain]
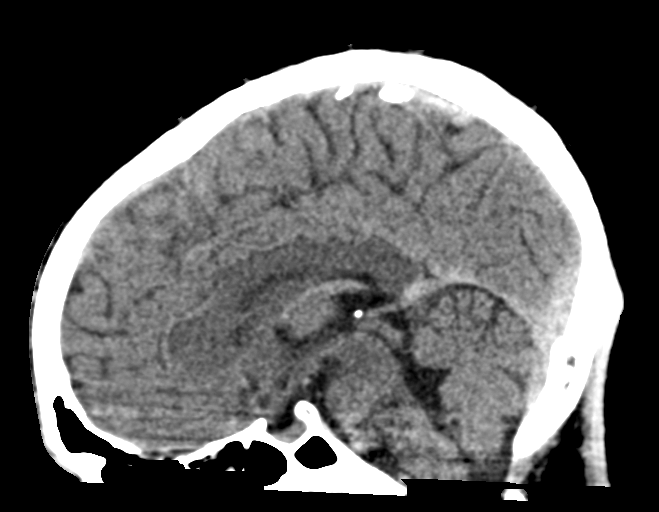
[im 33/49  brain]
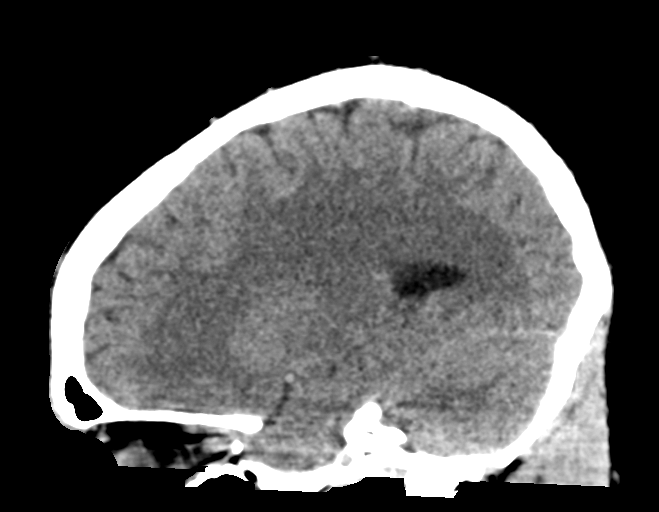

[16 of 47 positions shown; findings below may reference images not displayed]

FINDINGS: Brain: No intracranial hemorrhage, mass effect or midline shift. No
hydrocephalus. No acute cortical infarction. No intra or extra-axial
fluid collection. No mass lesion is noted on this unenhanced scan.

Vascular: No hyperdense vessel or unexpected calcification.

Skull: Normal. Negative for fracture or focal lesion.

Sinuses/Orbits: No acute finding.

Other: None.
IMPRESSION: No acute intracranial abnormality.
